# Patient Record
Sex: Female | Born: 2000 | Race: Black or African American | Hispanic: No | Marital: Single | Smoking: Never smoker
Health system: Southern US, Community
[De-identification: ages and names within clinical notes are randomized; demographics above are authoritative.]

---

## 2020-04-05 ENCOUNTER — Other Ambulatory Visit: Payer: Self-pay

## 2020-04-05 ENCOUNTER — Encounter (HOSPITAL_COMMUNITY): Payer: Self-pay | Admitting: Emergency Medicine

## 2020-04-05 ENCOUNTER — Emergency Department (HOSPITAL_COMMUNITY)
Admission: EM | Admit: 2020-04-05 | Discharge: 2020-04-05 | Disposition: A | Discharge status: Home / Self Care | Payer: Medicaid Other | Attending: Emergency Medicine | Admitting: Emergency Medicine

## 2020-04-05 DIAGNOSIS — Z5321 Procedure and treatment not carried out due to patient leaving prior to being seen by health care provider: Secondary | ICD-10-CM | POA: Insufficient documentation

## 2020-04-05 DIAGNOSIS — R110 Nausea: Secondary | ICD-10-CM | POA: Diagnosis not present

## 2020-04-05 LAB — COMPREHENSIVE METABOLIC PANEL
ALT: 16 U/L (ref 0–44)
AST: 24 U/L (ref 15–41)
Albumin: 3.6 g/dL (ref 3.5–5.0)
Alkaline Phosphatase: 42 U/L (ref 38–126)
Anion gap: 9 (ref 5–15)
BUN: 6 mg/dL (ref 6–20)
CO2: 23 mmol/L (ref 22–32)
Calcium: 8.6 mg/dL — ABNORMAL LOW (ref 8.9–10.3)
Chloride: 105 mmol/L (ref 98–111)
Creatinine, Ser: 0.69 mg/dL (ref 0.44–1.00)
GFR, Estimated: >60 mL/min (ref >60)
Glucose, Bld: 75 mg/dL (ref 70–99)
Potassium: 4.5 mmol/L (ref 3.5–5.1)
Sodium: 137 mmol/L (ref 135–145)
Total Bilirubin: 1.1 mg/dL (ref 0.3–1.2)
Total Protein: 6.7 g/dL (ref 6.5–8.1)

## 2020-04-05 LAB — CBC
HCT: 38.6 % (ref 36.0–46.0)
Hemoglobin: 12.3 g/dL (ref 12.0–15.0)
MCH: 28.3 pg (ref 26.0–34.0)
MCHC: 31.9 g/dL (ref 30.0–36.0)
MCV: 88.7 fL (ref 80.0–100.0)
Platelets: 292 10*3/uL (ref 150–400)
RBC: 4.35 MIL/uL (ref 3.87–5.11)
RDW: 13.7 % (ref 11.5–15.5)
WBC: 6.7 10*3/uL (ref 4.0–10.5)
nRBC: 0 % (ref 0.0–0.2)

## 2020-04-05 LAB — LIPASE, BLOOD: Lipase: 26 U/L (ref 11–51)

## 2020-04-05 LAB — URINALYSIS, ROUTINE W REFLEX MICROSCOPIC
Bacteria, UA: NONE SEEN
Bilirubin Urine: NEGATIVE
Glucose, UA: NEGATIVE mg/dL
Hgb urine dipstick: NEGATIVE
Ketones, ur: 5 mg/dL — AB
Nitrite: NEGATIVE
Protein, ur: NEGATIVE mg/dL
Specific Gravity, Urine: 1.017 (ref 1.005–1.030)
pH: 5 (ref 5.0–8.0)

## 2020-04-05 LAB — I-STAT BETA HCG BLOOD, ED (MC, WL, AP ONLY): I-stat hCG, quantitative: <5 m[IU]/mL (ref <5)

## 2020-04-05 MED ORDER — ONDANSETRON 4 MG PO TBDP
4.0000 mg | ORAL_TABLET | Freq: Once | ORAL | Status: DC | PRN
  Filled 2020-04-05: qty 1

## 2020-04-05 NOTE — ED Notes (Signed)
Pt left due to not being seen quick enough 

## 2020-04-05 NOTE — ED Triage Notes (Signed)
Patient reports nausea this evening , no emesis or diarrhea , denies fever or chills .

## 2020-09-17 ENCOUNTER — Other Ambulatory Visit: Payer: Self-pay

## 2020-09-17 ENCOUNTER — Encounter (HOSPITAL_BASED_OUTPATIENT_CLINIC_OR_DEPARTMENT_OTHER): Payer: Self-pay

## 2020-09-17 ENCOUNTER — Emergency Department (HOSPITAL_BASED_OUTPATIENT_CLINIC_OR_DEPARTMENT_OTHER)
Admission: EM | Admit: 2020-09-17 | Discharge: 2020-09-18 | Disposition: A | Discharge status: Home / Self Care | Payer: Managed Care, Other (non HMO) | Attending: Emergency Medicine | Admitting: Emergency Medicine

## 2020-09-17 DIAGNOSIS — R112 Nausea with vomiting, unspecified: Secondary | ICD-10-CM | POA: Diagnosis not present

## 2020-09-17 DIAGNOSIS — R1084 Generalized abdominal pain: Secondary | ICD-10-CM | POA: Diagnosis present

## 2020-09-17 DIAGNOSIS — D72829 Elevated white blood cell count, unspecified: Secondary | ICD-10-CM | POA: Insufficient documentation

## 2020-09-17 DIAGNOSIS — K529 Noninfective gastroenteritis and colitis, unspecified: Secondary | ICD-10-CM

## 2020-09-17 LAB — CBC
HCT: 38.3 % (ref 36.0–46.0)
Hemoglobin: 12.5 g/dL (ref 12.0–15.0)
MCH: 28.7 pg (ref 26.0–34.0)
MCHC: 32.6 g/dL (ref 30.0–36.0)
MCV: 88 fL (ref 80.0–100.0)
Platelets: 256 10*3/uL (ref 150–400)
RBC: 4.35 MIL/uL (ref 3.87–5.11)
RDW: 13.4 % (ref 11.5–15.5)
WBC: 13.4 10*3/uL — ABNORMAL HIGH (ref 4.0–10.5)
nRBC: 0 % (ref 0.0–0.2)

## 2020-09-17 MED ORDER — ONDANSETRON HCL 4 MG/2ML IJ SOLN
4.0000 mg | Freq: Once | INTRAMUSCULAR | Status: DC | PRN
  Filled 2020-09-17: qty 2

## 2020-09-17 NOTE — ED Triage Notes (Signed)
Patient here POV from Home with ABD Pain (Lower) since this AM.   Nausea and Vomiting today as well. No Diarrhea or Urinary Symptoms.  NAD; GCS 15; BIB Wheelchair

## 2020-09-18 ENCOUNTER — Emergency Department (HOSPITAL_BASED_OUTPATIENT_CLINIC_OR_DEPARTMENT_OTHER): Payer: Managed Care, Other (non HMO)

## 2020-09-18 DIAGNOSIS — R1084 Generalized abdominal pain: Secondary | ICD-10-CM | POA: Diagnosis not present

## 2020-09-18 LAB — COMPREHENSIVE METABOLIC PANEL
ALT: 12 U/L (ref 0–44)
AST: 17 U/L (ref 15–41)
Albumin: 4.2 g/dL (ref 3.5–5.0)
Alkaline Phosphatase: 43 U/L (ref 38–126)
Anion gap: 10 (ref 5–15)
BUN: 8 mg/dL (ref 6–20)
CO2: 22 mmol/L (ref 22–32)
Calcium: 9.1 mg/dL (ref 8.9–10.3)
Chloride: 106 mmol/L (ref 98–111)
Creatinine, Ser: 0.51 mg/dL (ref 0.44–1.00)
GFR, Estimated: >60 mL/min (ref >60)
Glucose, Bld: 112 mg/dL — ABNORMAL HIGH (ref 70–99)
Potassium: 3.6 mmol/L (ref 3.5–5.1)
Sodium: 138 mmol/L (ref 135–145)
Total Bilirubin: 1.9 mg/dL — ABNORMAL HIGH (ref 0.3–1.2)
Total Protein: 7.4 g/dL (ref 6.5–8.1)

## 2020-09-18 LAB — URINALYSIS, ROUTINE W REFLEX MICROSCOPIC
Bilirubin Urine: NEGATIVE
Glucose, UA: NEGATIVE mg/dL
Ketones, ur: 40 mg/dL — AB
Nitrite: NEGATIVE
Protein, ur: 30 mg/dL — AB
Specific Gravity, Urine: 1.022 (ref 1.005–1.030)
pH: 8 (ref 5.0–8.0)

## 2020-09-18 LAB — LIPASE, BLOOD: Lipase: <10 U/L — ABNORMAL LOW (ref 11–51)

## 2020-09-18 LAB — PREGNANCY, URINE: Preg Test, Ur: NEGATIVE

## 2020-09-18 MED ORDER — TRAMADOL HCL 50 MG PO TABS: 50.0000 mg | ORAL_TABLET | Freq: Four times a day (QID) | ORAL | 0 refills | Status: AC | PRN

## 2020-09-18 MED ORDER — ONDANSETRON HCL 8 MG PO TABS: 8.0000 mg | ORAL_TABLET | ORAL | 0 refills | Status: AC | PRN

## 2020-09-18 MED ORDER — TRAMADOL HCL 50 MG PO TABS
50.0000 mg | ORAL_TABLET | Freq: Once | ORAL | Status: AC
Start: 2020-09-18 — End: 2020-09-18
  Administered 2020-09-18: 50 mg via ORAL
  Filled 2020-09-18: qty 1

## 2020-09-18 MED ORDER — KETOROLAC TROMETHAMINE 30 MG/ML IJ SOLN
30.0000 mg | Freq: Once | INTRAMUSCULAR | Status: AC
  Administered 2020-09-18: 30 mg via INTRAVENOUS
  Filled 2020-09-18: qty 1

## 2020-09-18 MED ORDER — SODIUM CHLORIDE 0.9 % IV BOLUS
1000.0000 mL | Freq: Once | INTRAVENOUS | Status: AC
  Administered 2020-09-18: 1000 mL via INTRAVENOUS

## 2020-09-18 MED ORDER — ONDANSETRON HCL 4 MG/2ML IJ SOLN
4.0000 mg | Freq: Once | INTRAMUSCULAR | Status: AC
  Administered 2020-09-18: 4 mg via INTRAVENOUS
  Filled 2020-09-18: qty 2

## 2020-09-18 NOTE — ED Provider Notes (Signed)
[facility] EMERGENCY DEPT Provider Note   CSN: [id number] Arrival date & time: 09/17/20  2300     History Chief Complaint  Patient presents with  . Abdominal Pain    Lindsay Merritt is a 20 y.o. female.  Patient is a 20 year old female with no significant past medical history.  She presents today for evaluation of abdominal pain.  This started earlier this morning.  She describes cramping across her lower abdomen with associated nausea and several episodes of vomiting.  She denies any bloody stools or vomit.  She denies any diarrhea or constipation.  Patient denies any vaginal discharge and tells me she has not been sexually active.  Last menstrual period was several weeks ago.  She tells me she is vegan and did eat some [place] fries 2 days ago and is concerned this may have caused this.  The history is provided by the patient.  Abdominal Pain Pain location:  Generalized Pain quality: cramping   Pain radiates to:  Does not radiate Pain severity:  Moderate Onset quality:  Sudden Duration:  1 day Timing:  Constant Progression:  Worsening Chronicity:  New Relieved by:  Nothing Worsened by:  Movement and palpation      History reviewed. No pertinent past medical history.  There are no problems to display for this patient.   History reviewed. No pertinent surgical history.   OB History   No obstetric history on file.     No family history on file.  Social History   Tobacco Use  . Smoking status: Never Smoker  . Smokeless tobacco: Never Used  Substance Use Topics  . Alcohol use: Never  . Drug use: Never    Home Medications Prior to Admission medications   Not on File    Allergies    Patient has no known allergies.  Review of Systems   Review of Systems  Gastrointestinal: Positive for abdominal pain.  All other systems reviewed and are negative.   Physical Exam Updated Vital Signs BP 110/84 (BP Location: Left Arm)   Pulse (!) 126    Temp 98.3 F (36.8 C) (Oral)   Resp 16   Ht 5\' 4"  (1.626 m)   Wt 59 kg   SpO2 100%   BMI 22.31 kg/m   Physical Exam Vitals and nursing note reviewed.  Constitutional:      General: She is not in acute distress.    Appearance: She is well-developed. She is not diaphoretic.  HENT:     Head: Normocephalic and atraumatic.  Cardiovascular:     Rate and Rhythm: Normal rate and regular rhythm.     Heart sounds: No murmur heard. No friction rub. No gallop.   Pulmonary:     Effort: Pulmonary effort is normal. No respiratory distress.     Breath sounds: Normal breath sounds. No wheezing.  Abdominal:     General: Bowel sounds are normal. There is no distension.     Palpations: Abdomen is soft.     Tenderness: There is generalized abdominal tenderness. There is no right CVA tenderness, left CVA tenderness, guarding or rebound.     Comments: There is generalized abdominal tenderness, most notably across the right lower quadrant, suprapubic region, and left lower quadrant.  There is no rebound or guarding.  Musculoskeletal:        General: Normal range of motion.     Cervical back: Normal range of motion and neck supple.  Skin:    General: Skin is warm and  dry.  Neurological:     Mental Status: She is alert and oriented to person, place, and time.     ED Results / Procedures / Treatments   Labs (all labs ordered are listed, but only abnormal results are displayed) Labs Reviewed  CBC - Abnormal; Notable for the following components:      Result Value   WBC 13.4 (*)    All other components within normal limits  LIPASE, BLOOD  COMPREHENSIVE METABOLIC PANEL  URINALYSIS, ROUTINE W REFLEX MICROSCOPIC  PREGNANCY, URINE    EKG None  Radiology No results found.  Procedures Procedures   Medications Ordered in ED Medications  ondansetron (ZOFRAN) injection 4 mg (has no administration in time range)  sodium chloride 0.9 % bolus 1,000 mL (has no administration in time range)   ondansetron (ZOFRAN) injection 4 mg (has no administration in time range)  ketorolac (TORADOL) 30 MG/ML injection 30 mg (has no administration in time range)    ED Course  I have reviewed the triage vital signs and the nursing notes.  Pertinent labs & imaging results that were available during my care of the patient were reviewed by me and considered in my medical decision making (see chart for details).    MDM Rules/Calculators/A&P  Patient presenting here with complaints of generalized abdominal cramping along with nausea and vomiting that started earlier today.  Patient arrives with stable vital signs.  She does have generalized abdominal tenderness, but nothing focal.  Work-up shows a slight leukocytosis with CT scan showing free fluid within the leaves of the mesentery, likely related to GI inflammation.  She also has a follicular cyst in the left ovary, but do not feel as though further work-up is indicated into this.  Symptoms not consistent with torsion and do not feel the need to pursue this possibility.  She will be discharged with Zofran and tramadol.  She is to follow-up as needed.  Final Clinical Impression(s) / ED Diagnoses Final diagnoses:  None    Rx / DC Orders ED Discharge Orders    None       Geoffery Lyons, MD 09/18/20 [phone]

## 2020-09-18 NOTE — Discharge Instructions (Addendum)
Begin taking Zofran as needed for nausea and tramadol as prescribed as needed for pain.  Clear liquid diet for the next 12 hours, then slowly advance to normal as tolerated.  Follow-up with your primary doctor if symptoms or not improving in the next few days, and return to the ER if you develop worsening pain, high fever, bloody stools, or other new and concerning symptoms.

## 2020-10-08 ENCOUNTER — Ambulatory Visit (HOSPITAL_COMMUNITY)
Admission: EM | Admit: 2020-10-08 | Discharge: 2020-10-08 | Disposition: A | Discharge status: Home / Self Care | Payer: Managed Care, Other (non HMO)

## 2020-10-08 ENCOUNTER — Other Ambulatory Visit: Payer: Self-pay

## 2020-10-08 NOTE — ED Notes (Signed)
Per registration, patient's phone was dying and she decided to leave.  States she will come back tomorrow

## 2020-11-05 ENCOUNTER — Ambulatory Visit: Payer: Self-pay

## 2021-03-22 ENCOUNTER — Other Ambulatory Visit: Payer: Self-pay

## 2021-03-22 ENCOUNTER — Emergency Department
Admission: EM | Admit: 2021-03-22 | Discharge: 2021-03-22 | Disposition: A | Discharge status: Home / Self Care | Payer: Medicaid Other | Attending: Emergency Medicine | Admitting: Emergency Medicine

## 2021-03-22 DIAGNOSIS — R21 Rash and other nonspecific skin eruption: Secondary | ICD-10-CM | POA: Diagnosis present

## 2021-03-22 DIAGNOSIS — L42 Pityriasis rosea: Secondary | ICD-10-CM | POA: Insufficient documentation

## 2021-03-22 NOTE — ED Triage Notes (Signed)
Raised rash by 3 days that is getting worse.  Denies any fever SOB.  Has itching.  Rash is on arms, trunk, legs. Denies on head, hands or feet.

## 2021-03-22 NOTE — Discharge Instructions (Signed)
Take Benadryl at night and Zyrtec in the morning.  You may also use hydrocortisone cream to help with itching. Follow up with primary care or dermatology for symptoms that last more than a week.

## 2021-03-22 NOTE — ED Provider Notes (Signed)
[facility] Emergency Department Provider Note  ____________________________________________  Time seen: Approximately 8:25 AM  I have reviewed the triage vital signs and the nursing notes.   HISTORY  Chief Complaint Rash (Body rash by 3 days)   HPI Lindsay Merritt is a 20 y.o. female presents to the emergency department for treatment and evaluation of pruritic rash that started approximately 3 days ago.  Rash started on the back and stomach and has progressed to her legs.  She denies fever or any viral symptoms.  No alleviating measures attempted prior to arrival No past medical history on file.  There are no problems to display for this patient.   No past surgical history on file.  Prior to Admission medications   Medication Sig Start Date End Date Taking? Authorizing Provider  ondansetron (ZOFRAN) 8 MG tablet Take 1 tablet (8 mg total) by mouth every 4 (four) hours as needed for nausea. 09/18/20   Geoffery Lyons, MD  traMADol (ULTRAM) 50 MG tablet Take 1 tablet (50 mg total) by mouth every 6 (six) hours as needed. 09/18/20   Geoffery Lyons, MD    Allergies Patient has no known allergies.  No family history on file.  Social History Social History   Tobacco Use   Smoking status: Never   Smokeless tobacco: Never  Substance Use Topics   Alcohol use: Never   Drug use: Never    Review of Systems  Constitutional: Negative for fever. Respiratory: Negative for cough or shortness of breath.  Musculoskeletal: Negative for myalgias Skin: Positive for rash Neurological: Negative for numbness or paresthesias. ____________________________________________   PHYSICAL EXAM:  VITAL SIGNS: ED Triage Vitals  Enc Vitals Group     BP 03/22/21 0434 105/77     Pulse Rate 03/22/21 0434 73     Resp 03/22/21 0434 18     Temp 03/22/21 0434 98 F (36.7 C)     Temp Source 03/22/21 0434 Oral     SpO2 03/22/21 0434 96 %     Weight 03/22/21 0437 130 lb (59 kg)      Height 03/22/21 0437 5\' 4"  (1.626 m)     Head Circumference --      Peak Flow --      Pain Score 03/22/21 0436 0     Pain Loc --      Pain Edu? --      Excl. in GC? --      Constitutional:  Well appearing. Eyes: Conjunctivae are clear without discharge or drainage. Nose: No rhinorrhea noted. Mouth/Throat: Airway is patent.  Neck: No stridor. Unrestricted range of motion observed. Cardiovascular: Capillary refill is <3 seconds.  Respiratory: Respirations are even and unlabored.. Musculoskeletal: Unrestricted range of motion observed. Neurologic: Awake, alert, and oriented x 4.  Skin: Oval-shaped, mildly erythematous, papulosquamous lesions noted on the back and trunk and anterior proximal thighs.  Herald patch noted on the right lower back.  ____________________________________________   LABS (all labs ordered are listed, but only abnormal results are displayed)  Labs Reviewed - No data to display ____________________________________________  EKG  Not indicated. ____________________________________________  RADIOLOGY  Not indicated ____________________________________________   PROCEDURES  Procedures ____________________________________________   INITIAL IMPRESSION / ASSESSMENT AND PLAN / ED COURSE  Lindsay Merritt is a 20 y.o. female presenting to the emergency department for treatment and evaluation of a mildly pruritic rash that appeared 3 days ago.  See HPI for details.  On exam, the rash does appear to be pityriasis rosea.  Information provided to the patient.  She is to take Benadryl at night and cetirizine in the morning.  She was encouraged to apply hydrocortisone cream as needed for itching.  She is to follow-up with primary care or dermatology if her symptoms do not improve over the week.  She was encouraged to return to the emergency department for symptoms of change or worsen if unable to schedule an appointment.  Medications - No data to  display   Pertinent labs & imaging results that were available during my care of the patient were reviewed by me and considered in my medical decision making (see chart for details).  ____________________________________________   FINAL CLINICAL IMPRESSION(S) / ED DIAGNOSES  Final diagnoses:  Pityriasis rosea    ED Discharge Orders     None        Note:  This document was prepared using Dragon voice recognition software and may include unintentional dictation errors.   Chinita Pester, FNP 03/22/21 1359    Delton Prairie, MD 03/22/21 1455

## 2022-09-22 IMAGING — CT CT ABD-PELV W/O CM
2 of 4 series · 15 of 46 positions shown, 17 images · non-contrast
Comparison: None.

CLINICAL DATA: Lower abdominal pain, nausea, vomiting

EXAM:
CT ABDOMEN AND PELVIS WITHOUT CONTRAST
TECHNIQUE: Multidetector CT imaging of the abdomen and pelvis was performed
following the standard protocol without IV contrast.

[Series 2: abd pel wo · axial · 0.54mm/px · z∈[-1074,-674]mm · 12 of 88 slices shown, 14 images]
[im 4/88  soft-tissue]
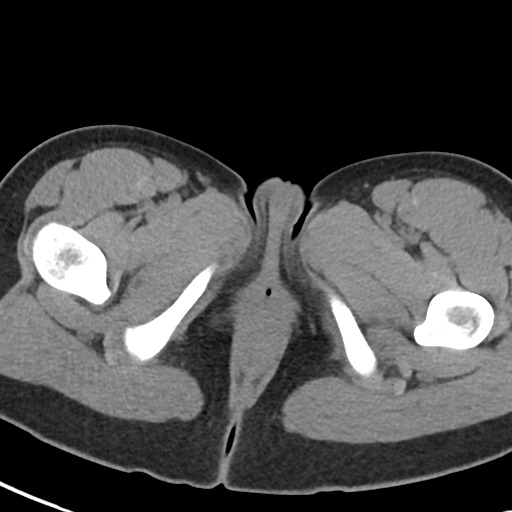
[im 4/88  bone]
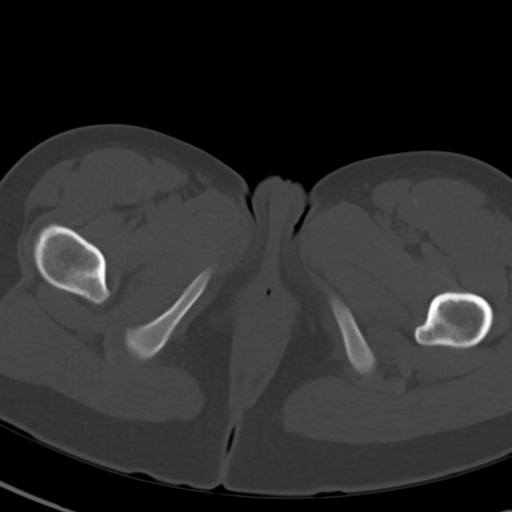
[im 11/88  soft-tissue]
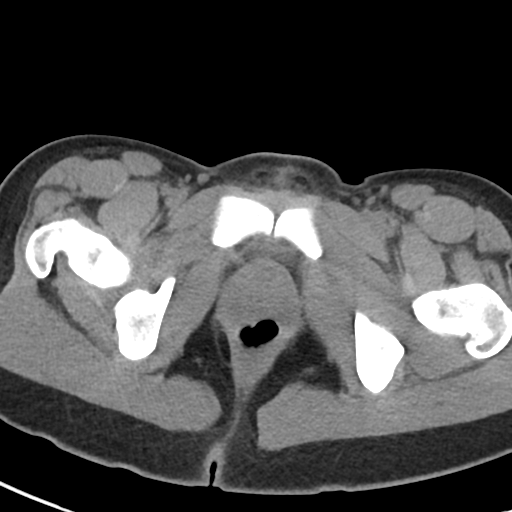
[im 19/88  soft-tissue]
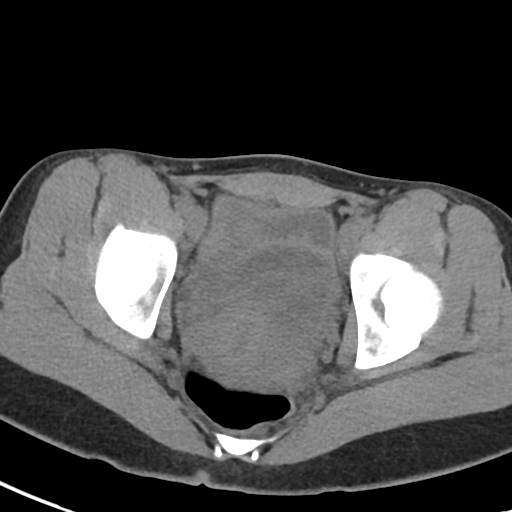
[im 26/88  soft-tissue]
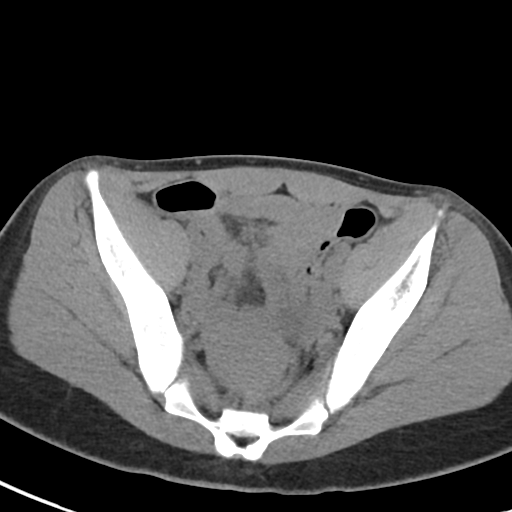
[im 33/88  soft-tissue]
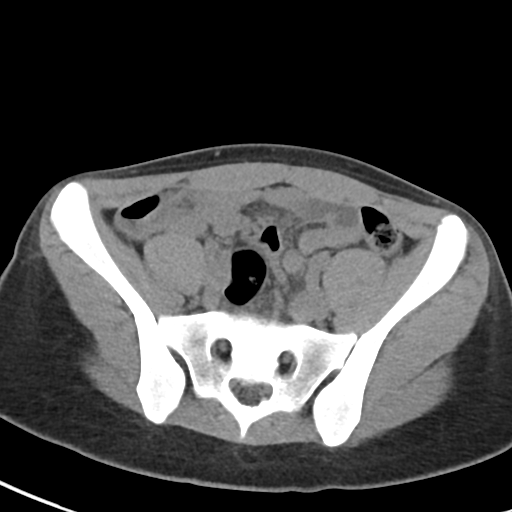
[im 40/88  soft-tissue]
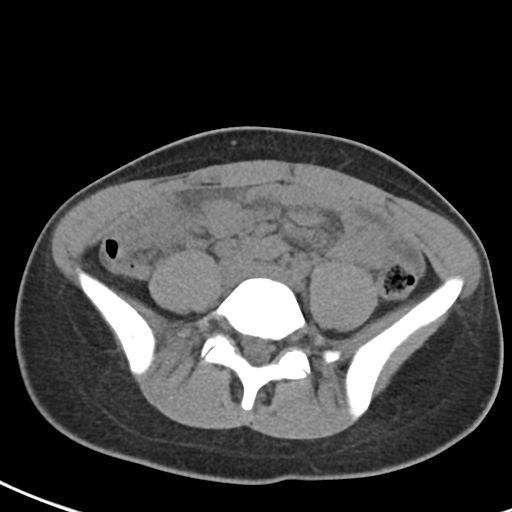
[im 48/88  soft-tissue]
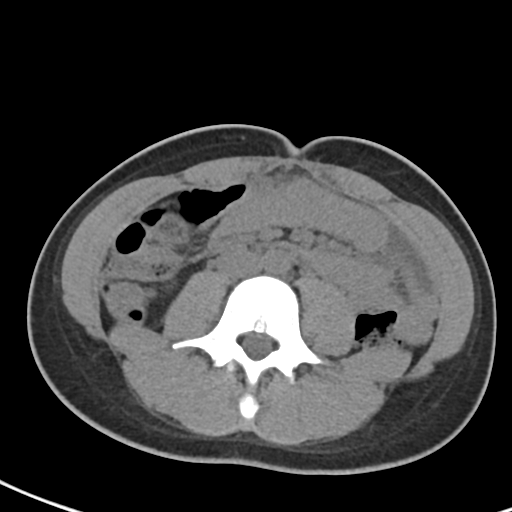
[im 55/88  soft-tissue]
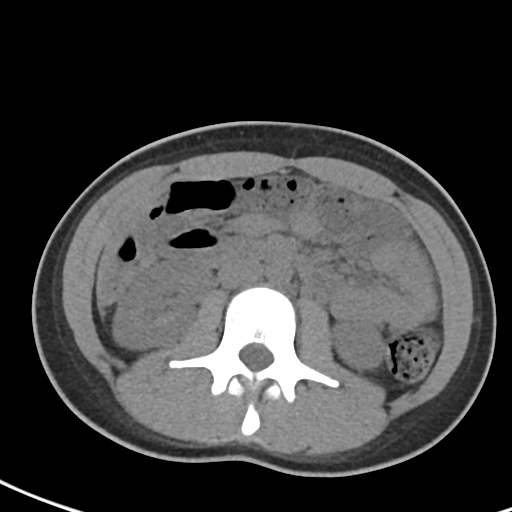
[im 62/88  soft-tissue]
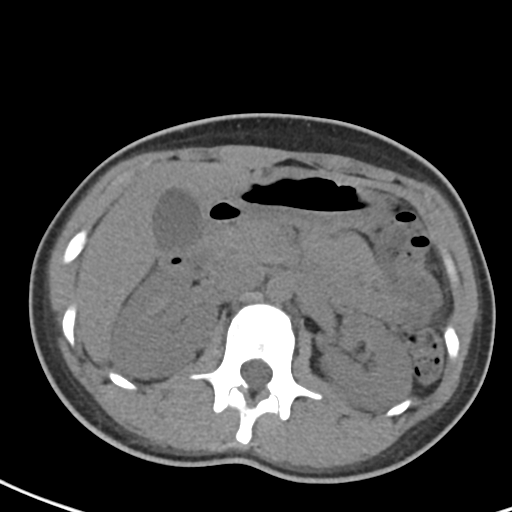
[im 62/88  bone]
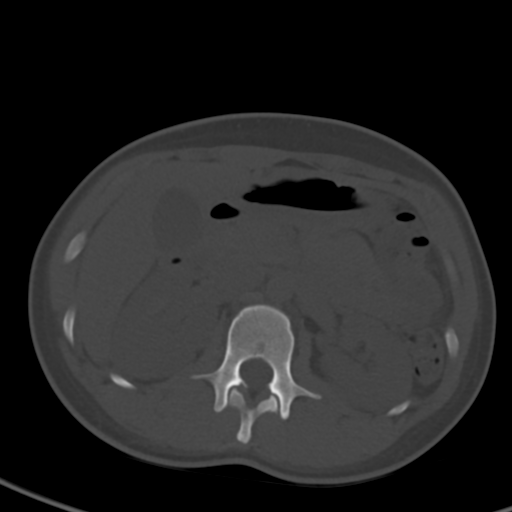
[im 69/88  soft-tissue]
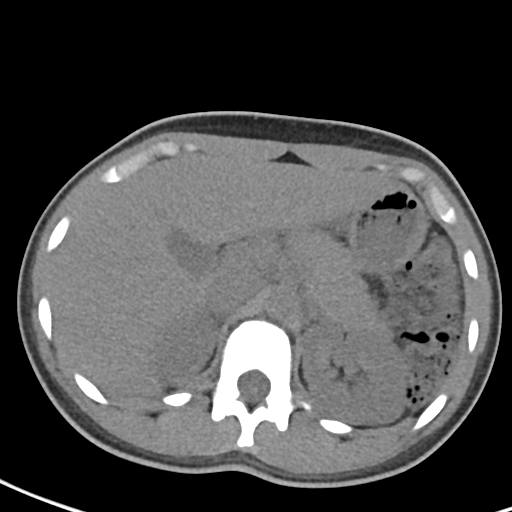
[im 77/88  soft-tissue]
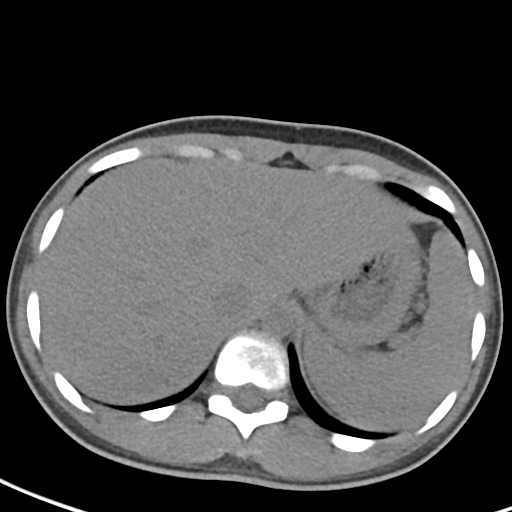
[im 84/88  soft-tissue]
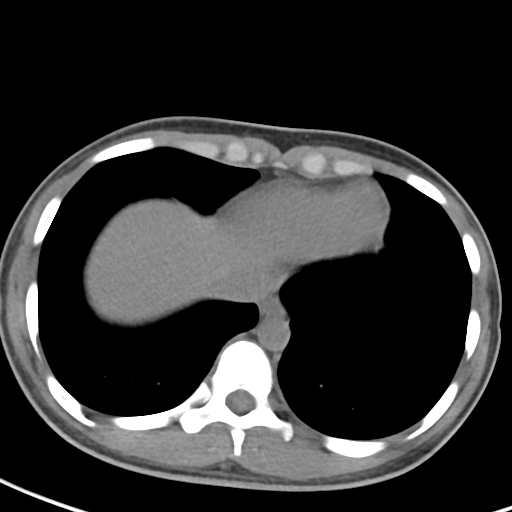

[Series 5: coronal · coronal · 0.60mm/px · 3 of 62 slices shown]
[im 21/62  soft-tissue]
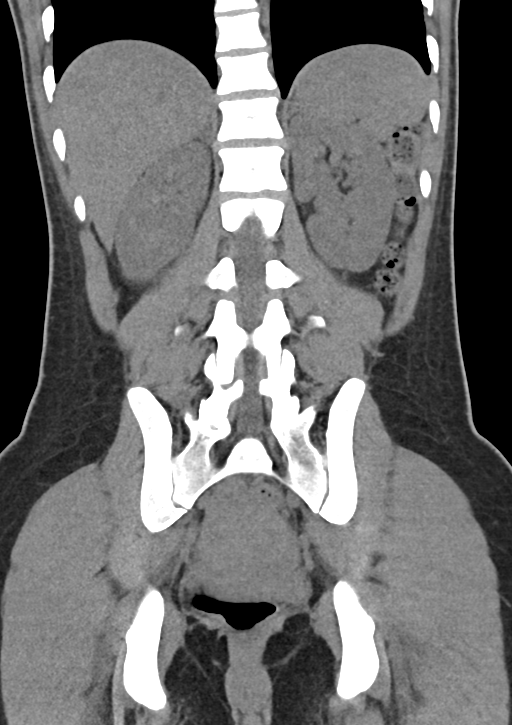
[im 28/62  soft-tissue]
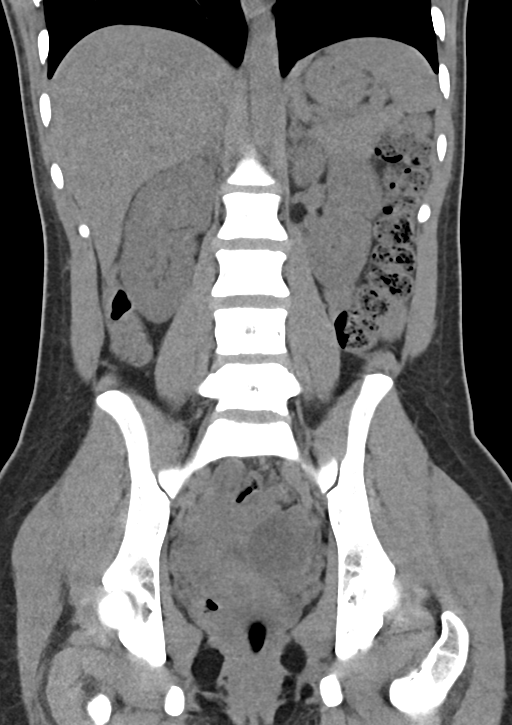
[im 34/62  soft-tissue]
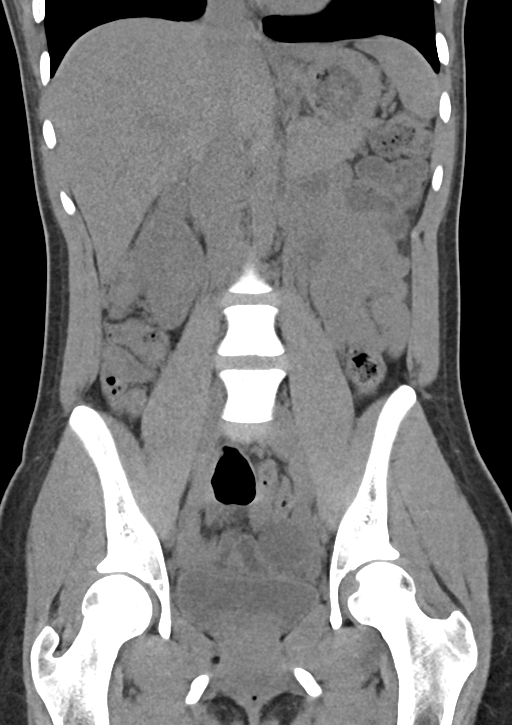

[15 of 46 positions shown; findings below may reference images not displayed]

FINDINGS: Lower chest: Visualized lung bases are clear bilaterally. The
visualized heart and pericardium are unremarkable.

Hepatobiliary: No focal liver abnormality is seen. No gallstones,
gallbladder wall thickening, or biliary dilatation.

Pancreas: Unremarkable

Spleen: Unremarkable

Adrenals/Urinary Tract: Adrenal glands are unremarkable. Kidneys are
normal, without renal calculi, focal lesion, or hydronephrosis.
Bladder is unremarkable.

Stomach/Bowel: Stomach is within normal limits. Appendix appears
normal. No evidence of bowel wall thickening, distention, or
inflammatory changes. There is mild free fluid within the mesentery.
No free intraperitoneal gas.

Vascular/Lymphatic: No significant vascular findings are present. No
enlarged abdominal or pelvic lymph nodes.

Reproductive: A 2.5 x 3.3 x 4.0 cm cystic lesion is seen within the
left adnexa, possibly representing a follicular cyst in a patient of
this age. This is not well characterized on this examination. The
pelvic organs are otherwise unremarkable.

Other: No abdominal wall hernia.  The rectum is unremarkable.

Musculoskeletal: No acute or significant osseous findings.
IMPRESSION: Mild free fluid within the leaves of the mesentery, abnormal but
nonspecific. This can be seen in the setting of gastrointestinal
inflammation, mesenteric venous hypertension, malabsorption, or
trauma.

Indeterminate 4 cm cystic lesion within the left ovary, most likely
representing a follicular cyst in a patient of this age. Correlation
with the patient's phase of menstruation is recommended. If
indicated, this could be better assessed with dedicated pelvic
sonography.

## 2022-12-27 ENCOUNTER — Emergency Department: Payer: BC Managed Care – PPO

## 2022-12-27 ENCOUNTER — Other Ambulatory Visit: Payer: Self-pay

## 2022-12-27 ENCOUNTER — Emergency Department
Admission: EM | Admit: 2022-12-27 | Discharge: 2022-12-27 | Disposition: A | Discharge status: Home / Self Care | Payer: BC Managed Care – PPO | Attending: Emergency Medicine | Admitting: Emergency Medicine

## 2022-12-27 DIAGNOSIS — S99922A Unspecified injury of left foot, initial encounter: Secondary | ICD-10-CM | POA: Diagnosis present

## 2022-12-27 DIAGNOSIS — S9032XA Contusion of left foot, initial encounter: Secondary | ICD-10-CM | POA: Diagnosis not present

## 2022-12-27 DIAGNOSIS — W208XXA Other cause of strike by thrown, projected or falling object, initial encounter: Secondary | ICD-10-CM | POA: Insufficient documentation

## 2022-12-27 MED ORDER — MELOXICAM 15 MG PO TABS: 15.0000 mg | ORAL_TABLET | Freq: Every day | ORAL | 0 refills | Status: AC

## 2022-12-27 NOTE — ED Provider Notes (Signed)
[facility] Provider Note  Patient Contact: 3:57 PM (approximate)   History   Foot Injury   HPI  Lindsay Merritt is a 22 y.o. female who presents to the emergency permit complaining of left foot pain.  Patient accidentally dropped a cabinet onto her left foot last night.  She was prepping to move when she dropped it onto her foot.  Patient has had bruising and pain since.  Still able to put weight on her foot but doing so increases the pain.  No other injury or complaint.  Patient denies any open wounds.  No history of previous injury or fracture to the foot.     Physical Exam   Triage Vital Signs: ED Triage Vitals  Encounter Vitals Group     BP 12/27/22 1420 109/77     Systolic BP Percentile --      Diastolic BP Percentile --      Pulse Rate 12/27/22 1420 67     Resp 12/27/22 1420 16     Temp 12/27/22 1420 98.7 F (37.1 C)     Temp Source 12/27/22 1420 Oral     SpO2 12/27/22 1420 99 %     Weight 12/27/22 1420 130 lb 1.1 oz (59 kg)     Height 12/27/22 1420 5\' 4"  (1.626 m)     Head Circumference --      Peak Flow --      Pain Score 12/27/22 1419 6     Pain Loc --      Pain Education --      Exclude from Growth Chart --     Most recent vital signs: Vitals:   12/27/22 1420  BP: 109/77  Pulse: 67  Resp: 16  Temp: 98.7 F (37.1 C)  SpO2: 99%     General: Alert and in no acute distress.  Cardiovascular:  Good peripheral perfusion Respiratory: Normal respiratory effort without tachypnea or retractions. Lungs CTAB.  Musculoskeletal: Full range of motion to all extremities.  Visualization of the left foot reveals mild ecchymosis along the medial midfoot but no open wounds.  Tender to palpation without palpable abnormality.  Pulse intact.  Sensation intact all digits.  Extension and flexion of all digits at this time. Neurologic:  No gross focal neurologic deficits are appreciated.  Skin:   No rash noted Other:   ED Results / Procedures /  Treatments   Labs (all labs ordered are listed, but only abnormal results are displayed) Labs Reviewed - No data to display   EKG     RADIOLOGY  I personally viewed, evaluated, and interpreted these images as part of my medical decision making, as well as reviewing the written report by the radiologist.  ED Provider Interpretation: No acute traumatic finding to the left foot on x-ray.  DG Foot Complete Left  Result Date: 12/27/2022 CLINICAL DATA:  Foot injury yesterday. Limping with limited weight-bearing. EXAM: LEFT FOOT - COMPLETE 3+ VIEW COMPARISON:  None Available. FINDINGS: The mineralization and alignment are normal. There is no evidence of acute fracture or dislocation. The joint spaces appear preserved. No evidence of foreign body, soft tissue emphysema or focal soft tissue swelling. IMPRESSION: No evidence of acute fracture or dislocation. Electronically Signed   By: Carey Bullocks M.D.   On: 12/27/2022 15:12    PROCEDURES:  Critical Care performed: No  Procedures   MEDICATIONS ORDERED IN ED: Medications - No data to display   IMPRESSION / MDM / ASSESSMENT AND PLAN /  ED COURSE  I reviewed the triage vital signs and the nursing notes.                                 Differential diagnosis includes, but is not limited to, foot contusion, foot fracture, foot laceration   Patient's presentation is most consistent with acute presentation with potential threat to life or bodily function.   Patient's diagnosis is consistent with contusion of the left foot.  Patient presents emergency department after dropping a piece of furniture on the left foot yesterday.  No open wounds.  X-ray revealed no acute traumatic finding.  Patient will be given anti-inflammatories and walking boot for symptom control.  Follow-up primary care as needed..  Patient is given ED precautions to return to the ED for any worsening or new symptoms.     FINAL CLINICAL IMPRESSION(S) / ED DIAGNOSES    Final diagnoses:  Contusion of left foot, initial encounter     Rx / DC Orders   ED Discharge Orders          Ordered    meloxicam (MOBIC) 15 MG tablet  Daily        12/27/22 1630             Note:  This document was prepared using Dragon voice recognition software and may include unintentional dictation errors.   Racheal Patches, PA-C 12/27/22 1630    Merwyn Katos, MD 12/27/22 [phone]

## 2022-12-27 NOTE — ED Triage Notes (Signed)
Pt here after dropping a nightstand on her left foot yesterday. Pt states she is not able to put weight on that foot. Pt limping in triage. Pt states she has bruising on the bottom of her foot.

## 2022-12-27 NOTE — ED Notes (Signed)
See triage notes. Patient had an end table fall on her foot
